# Patient Record
Sex: Male | Born: 1980 | Hispanic: Yes | Marital: Married | Smoking: Never smoker
Health system: Southern US, Community
[De-identification: ages and names within clinical notes are randomized; demographics above are authoritative.]

---

## 2014-06-19 ENCOUNTER — Ambulatory Visit (INDEPENDENT_AMBULATORY_CARE_PROVIDER_SITE_OTHER): Payer: 59 | Admitting: Internal Medicine

## 2014-06-19 ENCOUNTER — Ambulatory Visit (INDEPENDENT_AMBULATORY_CARE_PROVIDER_SITE_OTHER): Payer: 59

## 2014-06-19 VITALS — BP 132/78 | HR 82 | Temp 98.5°F | Resp 16 | Ht 65.5 in | Wt 182.2 lb

## 2014-06-19 DIAGNOSIS — M25531 Pain in right wrist: Secondary | ICD-10-CM

## 2014-06-19 DIAGNOSIS — M778 Other enthesopathies, not elsewhere classified: Secondary | ICD-10-CM

## 2014-06-19 DIAGNOSIS — S63501A Unspecified sprain of right wrist, initial encounter: Secondary | ICD-10-CM

## 2014-06-19 MED ORDER — MELOXICAM 15 MG PO TABS: 15.0000 mg | ORAL_TABLET | Freq: Every day | ORAL | Status: DC

## 2014-06-19 NOTE — Patient Instructions (Signed)
Ice, wear the splint while awake for 3 weeks even while at work. meloxicam as directed.  Return for re eval in 2 weeks or sooner if your symptoms worsen

## 2014-06-19 NOTE — Progress Notes (Signed)
   Subjective:    Patient ID: Jose Sparks, male    DOB: [birth year], 34 y.o.   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 No direct blunt  injury .  Pain in moderate in severity 6/10 in the radial area of the right wrist and is worse with motion   Pain radiates up the right arm with motion of the right hand  Occasionally notes swelling     Review of Systems  Musculoskeletal: Positive for joint swelling.       Pain and swelling of the right wrist  All other systems reviewed and are negative.      Objective:   Physical Exam  Constitutional: He is oriented to person, place, and time. He appears well-developed and well-nourished.  HENT:  Head: Normocephalic and atraumatic.  Nose: Nose normal.  Mouth/Throat: Oropharynx is clear and moist.  Eyes: Conjunctivae and EOM are normal. Pupils are equal, round, and reactive to light.  Neck: Normal range of motion. Neck supple.  Cardiovascular: Normal rate, regular rhythm and normal heart sounds.   Pulmonary/Chest: Effort normal and breath sounds normal.  Abdominal: Soft. Bowel sounds are normal.  Musculoskeletal: Normal range of motion. He exhibits tenderness.  Tenderness along the radial aspect of the right wrist, not tender in the anatomical snuff box  Pain on axial load and flexion Slight swelling no bruising  Pulse is normal radially  No pain over the flexor retinaculum  no numbness  Sensation and motor function of the r wrist is normal  Neurological: He is alert and oriented to person, place, and time. He has normal reflexes.  Skin: Skin is warm and dry.  Psychiatric: He has a normal mood and affect. His behavior is normal. Judgment and thought content normal.  Nursing note and vitals reviewed.   [facility] reading (PRIMARY) by  Dr. Glenis SmokerGottleib. negative for fracture or dislocation       Assessment & Plan:  1. Wrist pain 2. Wrist swelling Consistent with a Diagnosis of overuse tendonitis, wrist sprain,  Will treat with meloxicam and wrist immobilization with a thumb spica immobilizer placed in the office. The neuro vascular status of the right hand was re checked and is normal after application of the splint with removal splint. folllowup in 2 weeks for re eval

## 2016-04-01 ENCOUNTER — Ambulatory Visit (INDEPENDENT_AMBULATORY_CARE_PROVIDER_SITE_OTHER): Payer: 59 | Admitting: Family Medicine

## 2016-04-01 VITALS — BP 119/78 | HR 65 | Temp 98.5°F | Resp 16 | Ht 65.5 in | Wt 172.0 lb

## 2016-04-01 DIAGNOSIS — A09 Infectious gastroenteritis and colitis, unspecified: Secondary | ICD-10-CM

## 2016-04-01 DIAGNOSIS — J069 Acute upper respiratory infection, unspecified: Secondary | ICD-10-CM

## 2016-04-01 DIAGNOSIS — R197 Diarrhea, unspecified: Secondary | ICD-10-CM

## 2016-04-01 LAB — POCT CBC
Granulocyte percent: 68.5 %G (ref 37–80)
HCT, POC: 40.5 % — AB (ref 43.5–53.7)
HEMOGLOBIN: 14 g/dL — AB (ref 14.1–18.1)
LYMPH, POC: 1.6 (ref 0.6–3.4)
MCH, POC: 28.6 pg (ref 27–31.2)
MCHC: 34.7 g/dL (ref 31.8–35.4)
MCV: 82.5 fL (ref 80–97)
MID (cbc): 0.5 (ref 0–0.9)
MPV: 6.4 fL (ref 0–99.8)
PLATELET COUNT, POC: 233 10*3/uL (ref 142–424)
POC Granulocyte: 4.7 (ref 2–6.9)
POC LYMPH PERCENT: 23.5 %L (ref 10–50)
POC MID %: 8 %M (ref 0–12)
RBC: 4.91 M/uL (ref 4.69–6.13)
RDW, POC: 12.9 %
WBC: 6.8 10*3/uL (ref 4.6–10.2)

## 2016-04-01 MED ORDER — DICYCLOMINE HCL 10 MG PO CAPS: ORAL_CAPSULE | ORAL | 0 refills | Status: DC

## 2016-04-01 NOTE — Progress Notes (Signed)
Patient ID: Jose Sparks, male    DOB: 1980-09-20  Age: 36 y.o. MRN: [id number]  Chief Complaint  Patient presents with  . Diarrhea    x saturday    Subjective:   36 year old male who is generally healthy. Friday he started feeling a little bad with some fever and aches and she chills a little bit, but no diarrhea. On Saturday started having diarrhea which has persisted through Sunday and Monday and a little bit today. When he is had diarrhea in the past, it usually has been after eating something that triggered it and may be one round of diarrhea but then cleared on up. This is a little unusual for him. He is married with a family and others have not been ill. He has developed a mild upper respiratory infection symptoms today with a little irritation in his throat and mild coughing but not really a sore throat or runny nose or anything.  He works as a Quarry manager. He is [place]  Current allergies, medications, problem list, past/family and social histories reviewed.  Objective:  BP 119/78   Pulse 65   Temp 98.5 F (36.9 C) (Oral)   Resp 16   Ht 5' 5.5" (1.664 m)   Wt 172 lb (78 kg)   SpO2 98%   BMI 28.19 kg/m   No acute distress. Occasional mild clearing of his throat or cough. His throat is clear. Neck supple without nodes. Chest clear to auscultation. Heart regular without murmurs. Abdomen has normal bowel sounds. Not distended. Soft without organomegaly, masses, or tenderness. Genitorectal exam not done.  Assessment & Plan:   Assessment: 1. Diarrhea of presumed infectious origin   2. Acute upper respiratory infection       Plan: We'll check a CBC and CMP and decided what treatment he needs.  Orders Placed This Encounter  Procedures  . Comprehensive metabolic panel  . POCT CBC    Meds ordered this encounter  Medications  . dicyclomine (BENTYL) 10 MG capsule    Sig: Take 1 pill with meals and at bedtime as needed for diarrhea or abdominal  discomfort    Dispense:  20 capsule    Refill:  0   Results for orders placed or performed in visit on 04/01/16  POCT CBC  Result Value Ref Range   WBC 6.8 4.6 - 10.2 K/uL   Lymph, poc 1.6 0.6 - 3.4   POC LYMPH PERCENT 23.5 10 - 50 %L   MID (cbc) 0.5 0 - 0.9   POC MID % 8.0 0 - 12 %M   POC Granulocyte 4.7 2 - 6.9   Granulocyte percent 68.5 37 - 80 %G   RBC 4.91 4.69 - 6.13 M/uL   Hemoglobin 14.0 (A) 14.1 - 18.1 g/dL   HCT, POC 40.9 (A) 81.1 - 53.7 %   MCV 82.5 80 - 97 fL   MCH, POC 28.6 27 - 31.2 pg   MCHC 34.7 31.8 - 35.4 g/dL   RDW, POC 91.4 %   Platelet Count, POC 233 142 - 424 K/uL   MPV 6.4 0 - 99.8 fL   f      Patient Instructions   Drink plenty of fluids. Avoid rich, spicy, or fried and fatty foods.  Take dicyclomine 10 mg 1 pill with meals and at bedtime. This should help slow the bowel movements down.  Return if worse    IF you received an x-ray today, you will receive an invoice from [facility]. Please contact [facility] at [phone] with questions or concerns regarding your invoice.   IF you received labwork today, you will receive an invoice from [facility]. Please contact [facility] at [phone] with questions or concerns regarding your invoice.   Our billing staff will not be able to assist you with questions regarding bills from these companies.  You will be contacted with the lab results as soon as they are available. The fastest way to get your results is to activate your My Chart account. Instructions are located on the last page of this paperwork. If you have not heard from us regarding the results in 2 weeks, please contact this office.         Return if symptoms worsen or fail to improve.   Daxter Paule, MD 04/01/2016

## 2016-04-01 NOTE — Patient Instructions (Addendum)
Drink plenty of fluids. Avoid rich, spicy, or fried and fatty foods.  Take dicyclomine 10 mg 1 pill with meals and at bedtime. This should help slow the bowel movements down.  Return if worse    IF you received an x-ray today, you will receive an invoice from [facility]. Please contact [facility] at [phone] with questions or concerns regarding your invoice.   IF you received labwork today, you will receive an invoice from [facility]. Please contact [facility] at [phone] with questions or concerns regarding your invoice.   Our billing staff will not be able to assist you with questions regarding bills from these companies.  You will be contacted with the lab results as soon as they are available. The fastest way to get your results is to activate your My Chart account. Instructions are located on the last page of this paperwork. If you have not heard from us regarding the results in 2 weeks, please contact this office.

## 2016-04-02 ENCOUNTER — Encounter: Payer: Self-pay | Admitting: Emergency Medicine

## 2016-04-02 LAB — COMPREHENSIVE METABOLIC PANEL
A/G RATIO: 1.5 (ref 1.2–2.2)
ALK PHOS: 71 IU/L (ref 39–117)
ALT: 49 IU/L — ABNORMAL HIGH (ref 0–44)
AST: 28 IU/L (ref 0–40)
Albumin: 4.7 g/dL (ref 3.5–5.5)
BILIRUBIN TOTAL: 0.5 mg/dL (ref 0.0–1.2)
BUN/Creatinine Ratio: 11 (ref 9–20)
BUN: 10 mg/dL (ref 6–20)
CHLORIDE: 98 mmol/L (ref 96–106)
CO2: 24 mmol/L (ref 18–29)
CREATININE: 0.88 mg/dL (ref 0.76–1.27)
Calcium: 9.4 mg/dL (ref 8.7–10.2)
GFR calc Af Amer: 127 mL/min/{1.73_m2} (ref >59)
GFR calc non Af Amer: 110 mL/min/{1.73_m2} (ref >59)
GLOBULIN, TOTAL: 3.2 g/dL (ref 1.5–4.5)
Glucose: 88 mg/dL (ref 65–99)
POTASSIUM: 4.4 mmol/L (ref 3.5–5.2)
SODIUM: 138 mmol/L (ref 134–144)
Total Protein: 7.9 g/dL (ref 6.0–8.5)

## 2016-11-08 ENCOUNTER — Encounter: Payer: Self-pay | Admitting: Family Medicine

## 2016-11-08 ENCOUNTER — Ambulatory Visit (INDEPENDENT_AMBULATORY_CARE_PROVIDER_SITE_OTHER): Payer: 59 | Admitting: Family Medicine

## 2016-11-08 ENCOUNTER — Ambulatory Visit (INDEPENDENT_AMBULATORY_CARE_PROVIDER_SITE_OTHER): Payer: 59

## 2016-11-08 VITALS — BP 115/75 | HR 66 | Temp 98.6°F | Resp 16 | Ht 65.5 in | Wt 155.0 lb

## 2016-11-08 DIAGNOSIS — M25561 Pain in right knee: Secondary | ICD-10-CM | POA: Diagnosis not present

## 2016-11-08 DIAGNOSIS — S83421A Sprain of lateral collateral ligament of right knee, initial encounter: Secondary | ICD-10-CM

## 2016-11-08 MED ORDER — MELOXICAM 15 MG PO TABS: 15.0000 mg | ORAL_TABLET | Freq: Every day | ORAL | 0 refills | Status: DC

## 2016-11-08 NOTE — Patient Instructions (Signed)
Esguince del ligamento lateral externo de la rodilla con rehabilitacin faseI Lateral Collateral Knee Ligament Sprain, Phase I Rehab Consulte al mdico qu ejercicios son seguros para usted. Haga los ejercicios exactamente como se lo haya indicado el mdico y gradelos como se lo hayan indicado. Es normal sentir tirantez, tensin, presin o molestias leves mientras hace estos ejercicios, pero debe detenerse de inmediato si siente dolor repentino o si el dolor empeora.No comience a hacer estos ejercicios hasta que se lo indique el mdico. EJERCICIOS DE [employer] Y AMPLITUD DE MOVIMIENTOS Estos ejercicios calientan los msculos y las articulaciones, y mejoran la movilidad y la flexibilidad de la rodilla. Estos ejercicios tambin ayudan a Engineer, materials, el adormecimiento y el hormigueo. EjercicioA: Deslizamiento de taln 1. Acustese boca arriba con ambas rodillas extendidas. Si esto le ocasiona molestias en la espalda, flexione la rodilla sana, apoyando el pie en el suelo. 2. Acerque lentamente el taln izquierdo/derecho a las nalgas, Teacher, adult education sentir un estiramiento suave en la parte de adelante de la rodilla o el muslo. 3. Mantenga esta posicin durante ___________ segundos. 4. Lleve lentamente el taln izquierdo/derecho hasta la posicin inicial. Repita __________ veces. Realice este ejercicio __________ veces al da. EJERCICIOS DE FORTALECIMIENTO Estos ejercicios fortalecen la rodilla y le otorgan resistencia. La resistencia es la capacidad de usar los msculos durante un tiempo prolongado, incluso despus de que se cansen. EjercicioB: Elevaciones de la pierna extendida, cudriceps 1. Recustese boca arriba con la pierna izquierda/derecha extendida y la rodilla opuesta flexionada. 2. Tensione los msculos de la parte de adelante del muslo izquierdo/derecho. Deber ver la rtula que se desliza hacia arriba o que se profundizan los hoyuelos justo por encima de la rodilla. El muslo podr sacudirse  un poco. 3. Mantenga estos msculos contrados mientras levanta la pierna a una altura de 4 a 6pulgadas (10 a 15cm) del suelo. No flexione la rodilla. Si no puede evitar flexionar la rodilla al hacer este ejercicio, contraiga los msculos de la parte de adelante del muslo izquierdo/derecho, pero no eleve la pierna. 4. Mantenga esta posicin durante ___________ segundos. 5. Mantenga estos msculos contrados mientras baja la pierna. 6. Relaje los msculos lentamente y por completo. Repita __________ veces. Realice este ejercicio __________ veces al da. EjercicioC: Isquiotibiales con flexin Realice este ejercicio con pesas en los tobillos si se lo ha indicado el mdico. Comience con pesas de __________. Aumente el peso de [place] gradual, de a 1lb [phone]). No use pesas que pesen ms de __________. 1. Recustese boca abajo con las piernas extendidas. 2. Flexione la rodilla izquierda/derecha hasta donde se sienta cmodo. Mantenga la cadera sobre la superficie debajo. Al flexionar la rodilla, acerque el pie a las nalgas. No deje que se caiga a los costados. 3. Mantenga esta posicin durante ___________ segundos. 4. Baje lentamente la pierna hasta la posicin inicial. Repita __________ veces. Realice este ejercicio __________ veces al da. EjercicioD: Puente (extensores de la cadera) 1. Recustese boca arriba sobre una superficie firme con las rodillas flexionadas y los pies completamente apoyados en el suelo. 2. Tensione los msculos de las nalgas y [employer] que el tronco quede a nivel de los muslos.  No arquee la espalda.  Debe sentir el trabajo muscular en las nalgas y la parte de atrs de los muslos. Si no siente el estiramiento de estos msculos, aleje los pies 1 o 2pulgadas (2,5 o 5cm) de las nalgas. 1. Mantenga esta posicin durante ___________ segundos. 2. Baje lentamente la cadera a la posicin inicial. 3. Relaje los  msculos de las nalgas por completo entre  repeticiones. 4. Si este ejercicio le resulta muy fcil, intente realizarlo con los brazos cruzados sobre el pecho o levantando una pierna con la cadera levantada del suelo. Repita __________ veces. Realice este ejercicio __________ veces al da. EjercicioE: Elevacin del taln 1. Prese con los pies separados al ancho de los hombros. 2. Equilibre el peso de cuerpo sobre ambos pies [place]ientras se para en puntas de pie. Use una pared o una mesa para no perder el equilibrio, pero trate de no usarlos como apoyo. 3. Si este ejercicio le resulta demasiado fcil, lleve el peso hacia la pierna izquierda/derecha hasta sentir que le cuesta un poco ms. 4. Mantenga esta posicin durante ___________ segundos. 5. Baje lentamente hasta la posicin inicial. Repita __________ veces. Realice este ejercicio __________ veces al da. Esta informacin no tiene Theme park managercomo fin reemplazar el consejo del mdico. Asegrese de hacerle al mdico cualquier pregunta que tenga. Document Released: 01/01/2006 Document Revised: 04/07/2014 Document Reviewed: 01/27/2015 Elsevier Interactive Patient Education  2018 [employer]      IF you received an x-ray today, you will receive an invoice from [facility]. Please contact [facility] at [phone] with questions or concerns regarding your invoice.   IF you received labwork today, you will receive an invoice from [facility]. Please contact [facility] at [phone] with questions or concerns regarding your invoice.   Our billing staff will not be able to assist you with questions regarding bills from these companies.  You will be contacted with the lab results as soon as they are available. The fastest way to get your results is to activate your My Chart account. Instructions are located on the last page of this paperwork. If you have not heard from us regarding the results in 2 weeks, please contact this office.

## 2016-11-08 NOTE — Progress Notes (Signed)
Subjective:    Patient ID: Jose Sparks, male    DOB: Sep 04, 1980, 36 y.o.   MRN: [id number]  11/08/2016  Knee Pain (right, running after 2-3 miles, constant, throbbing)   HPI This 36 y.o. male presents for evaluation of R knee pain.   Training to run half marathon.  Onset of pain two weeks ago.  After 2-3 miles, developedR knee pain. Usually running 8-53miles.  Throbbing and pain; needed to stop.  Rested this entire week of running; tried to run again today, developed pain again.  No ice.  No swelling.  No limping.  Just pain.  Pain laterally and under knee cap.  Had been running 8-10 miles for one month.  Just started training in January 2018; two miles and increase 1-2 miles each month; half marathon in October 2018.  Had not been runnning for years.  NO medication for knee pain.  Help desk support.  Stopped two weeks ago.     BP Readings from Last 3 Encounters:  11/08/16 115/75  04/01/16 119/78  06/19/14 132/78   Wt Readings from Last 3 Encounters:  11/08/16 155 lb (70.3 kg)  04/01/16 172 lb (78 kg)  06/19/14 182 lb 3.2 oz (82.6 kg)    There is no immunization history on file for this patient.  Review of Systems  Constitutional: Negative for activity change, appetite change, chills, diaphoresis, fatigue and fever.  Respiratory: Negative for cough and shortness of breath.   Cardiovascular: Negative for chest pain, palpitations and leg swelling.  Gastrointestinal: Negative for abdominal pain, diarrhea, nausea and vomiting.  Endocrine: Negative for cold intolerance, heat intolerance, polydipsia, polyphagia and polyuria.  Musculoskeletal: Positive for arthralgias. Negative for gait problem and joint swelling.  Skin: Negative for color change, rash and wound.  Neurological: Negative for dizziness, tremors, seizures, syncope, facial asymmetry, speech difficulty, weakness, light-headedness, numbness and headaches.  Psychiatric/Behavioral: Negative for dysphoric mood and sleep  disturbance. The patient is not nervous/anxious.     History reviewed. No pertinent past medical history. History reviewed. No pertinent surgical history. No Known Allergies  Social History   Social History  . Marital status: Married    Spouse name: N/A  . Number of children: N/A  . Years of education: N/A   Occupational History  . Not on file.   Social History Main Topics  . Smoking status: Never Smoker  . Smokeless tobacco: Never Used  . Alcohol use No  . Drug use: No  . Sexual activity: Not on file   Other Topics Concern  . Not on file   Social History Narrative  . No narrative on file   Family History  Problem Relation Age of Onset  . Diabetes Mother        Objective:    BP 115/75   Pulse 66   Temp 98.6 F (37 C) (Oral)   Resp 16   Ht 5' 5.5" (1.664 m)   Wt 155 lb (70.3 kg)   SpO2 98%   BMI 25.40 kg/m  Physical Exam  Constitutional: He is oriented to person, place, and time. He appears well-developed and well-nourished. No distress.  HENT:  Head: Normocephalic and atraumatic.  Eyes: Pupils are equal, round, and reactive to light. Conjunctivae and EOM are normal.  Neck: Normal range of motion. Neck supple. Carotid bruit is not present.  Cardiovascular: Intact distal pulses.   Pulmonary/Chest: Effort normal.  Musculoskeletal:       Right knee: Normal. He exhibits normal range of motion, no swelling,  no effusion, no bony tenderness and normal meniscus. No tenderness found. No medial joint line, no lateral joint line, no MCL, no LCL and no patellar tendon tenderness noted.  Neurological: He is alert and oriented to person, place, and time. No cranial nerve deficit.  Skin: Skin is warm and dry. No rash noted. He is not diaphoretic.  Psychiatric: He has a normal mood and affect. His behavior is normal.  Nursing note and vitals reviewed.   Dg Knee Complete 4 Views Right  Result Date: 11/08/2016 CLINICAL DATA:  Knee pain after running EXAM: RIGHT KNEE -  COMPLETE 4+ VIEW COMPARISON:  None. FINDINGS: No acute fracture. No dislocation.  Unremarkable soft tissues. IMPRESSION: No acute bony pathology. Electronically Signed   By: Jolaine ClickArthur  Hoss M.D.   On: 11/08/2016 11:55   [facility] screen [facility] 11/08/2016 04/01/2016  Decreased Interest 0 0  Down, Depressed, Hopeless 0 0  PHQ - 2 Score 0 0   Fall Risk  11/08/2016 04/01/2016  Falls in the past year? No No        Assessment & Plan:   1. Acute pain of right knee    -New onset; occurs 2-3 miles into run; most consistent with IT band syndrome or patellofemoral syndrome.  Recommend rest, icing stretching, NSAIDs. -call office in two weeks if no improvement and will schedule follow-up appointment with Dr. Silas SacramentoJeff Greene.   Orders Placed This Encounter  Procedures  . DG Knee Complete 4 Views Right    Standing Status:   Future    Number of Occurrences:   1    Standing Expiration Date:   11/08/2017    Order Specific Question:   Reason for Exam (SYMPTOM  OR DIAGNOSIS REQUIRED)    Answer:   R lateral knee pain after 2-3 miles into run; no swelling; no popping    Order Specific Question:   Preferred imaging location?    Answer:   External   Meds ordered this encounter  Medications  . meloxicam (MOBIC) 15 MG tablet    Sig: Take 1 tablet (15 mg total) by mouth daily.    Dispense:  30 tablet    Refill:  0    No Follow-up on file.   Chad Tiznado Paulita FujitaMartin Pierra Skora, M.D. Primary [facility] at [facility]  [place] [facility] [address] [place], [place]  [id number] [phone] phone [phone] fax

## 2017-08-20 ENCOUNTER — Encounter: Payer: Self-pay | Admitting: Family Medicine

## 2018-05-30 IMAGING — DX DG KNEE COMPLETE 4+V*R*
4 series · 4 of 4 positions shown · non-contrast
Comparison: None.

CLINICAL DATA: Knee pain after running

EXAM:
RIGHT KNEE - COMPLETE 4+ VIEW

[knee ap]
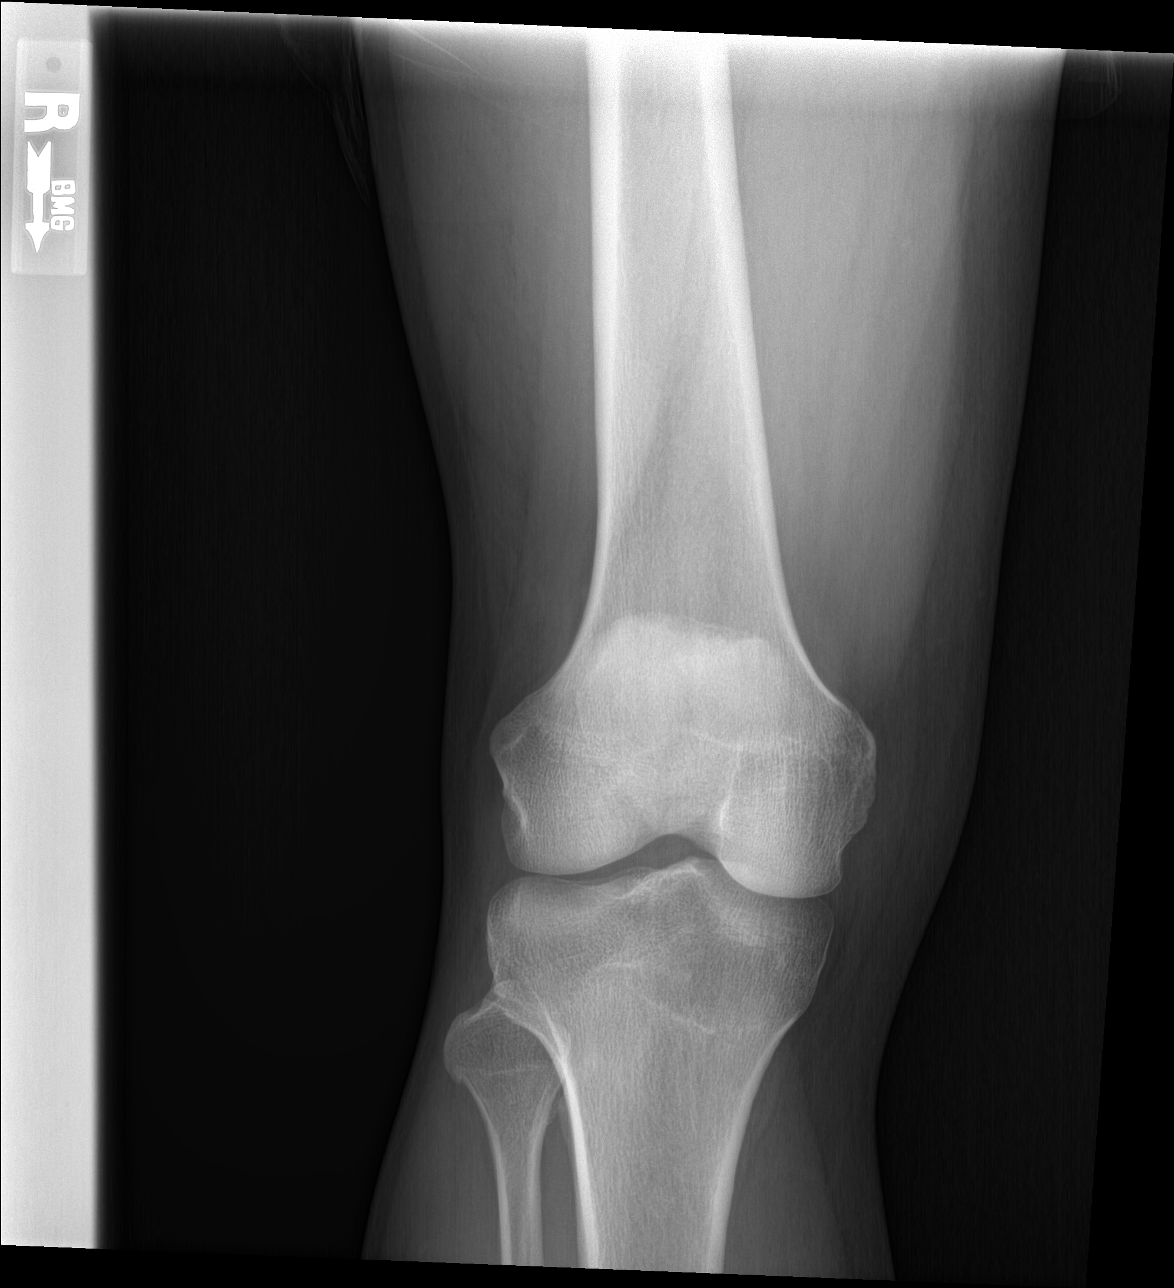

[knee lat]
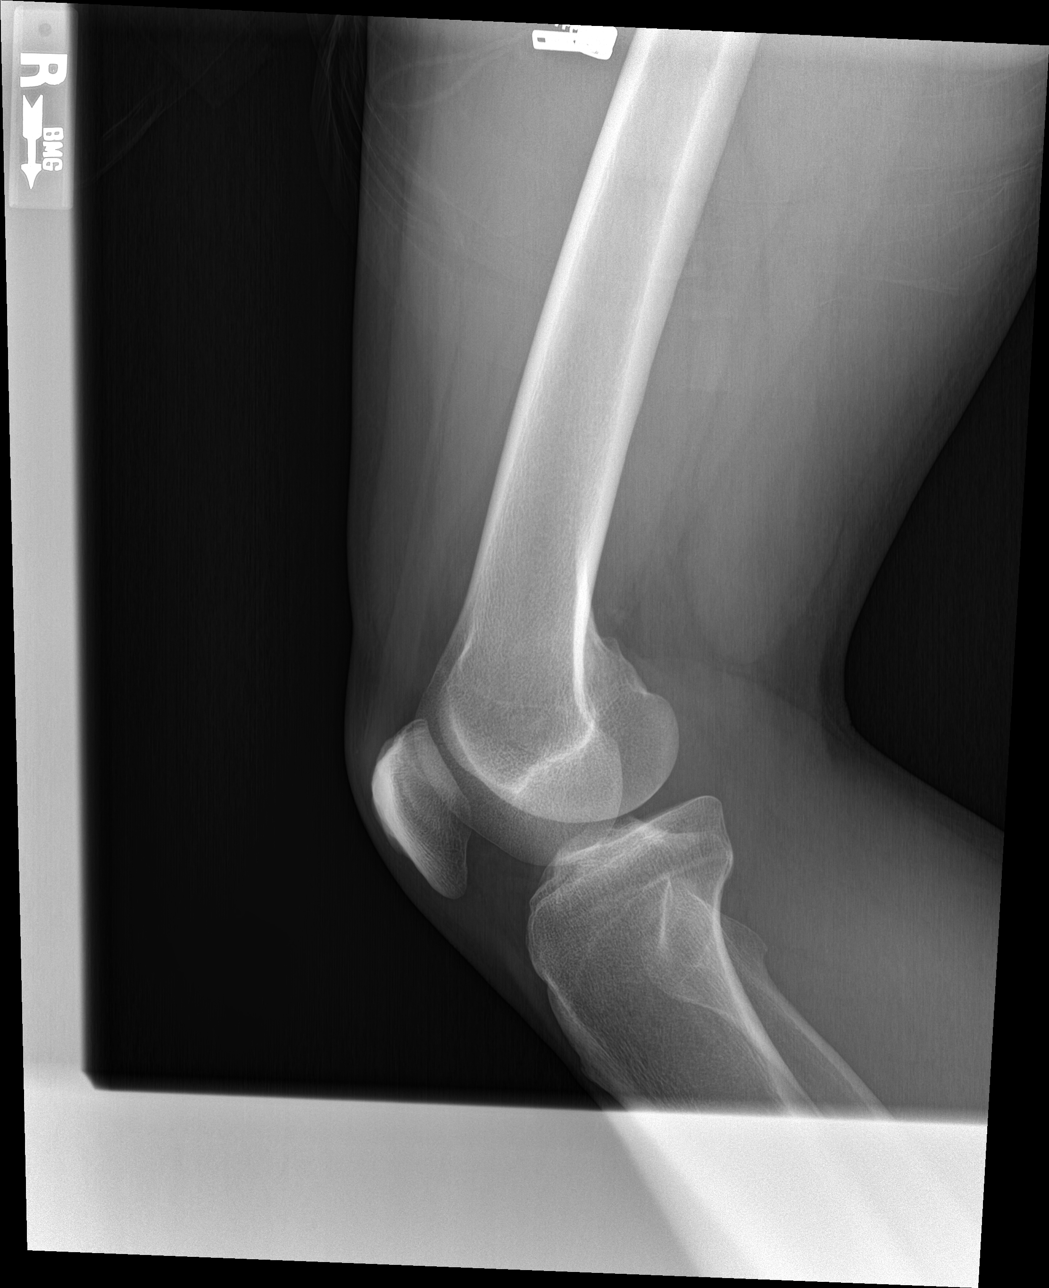

[sunrise]
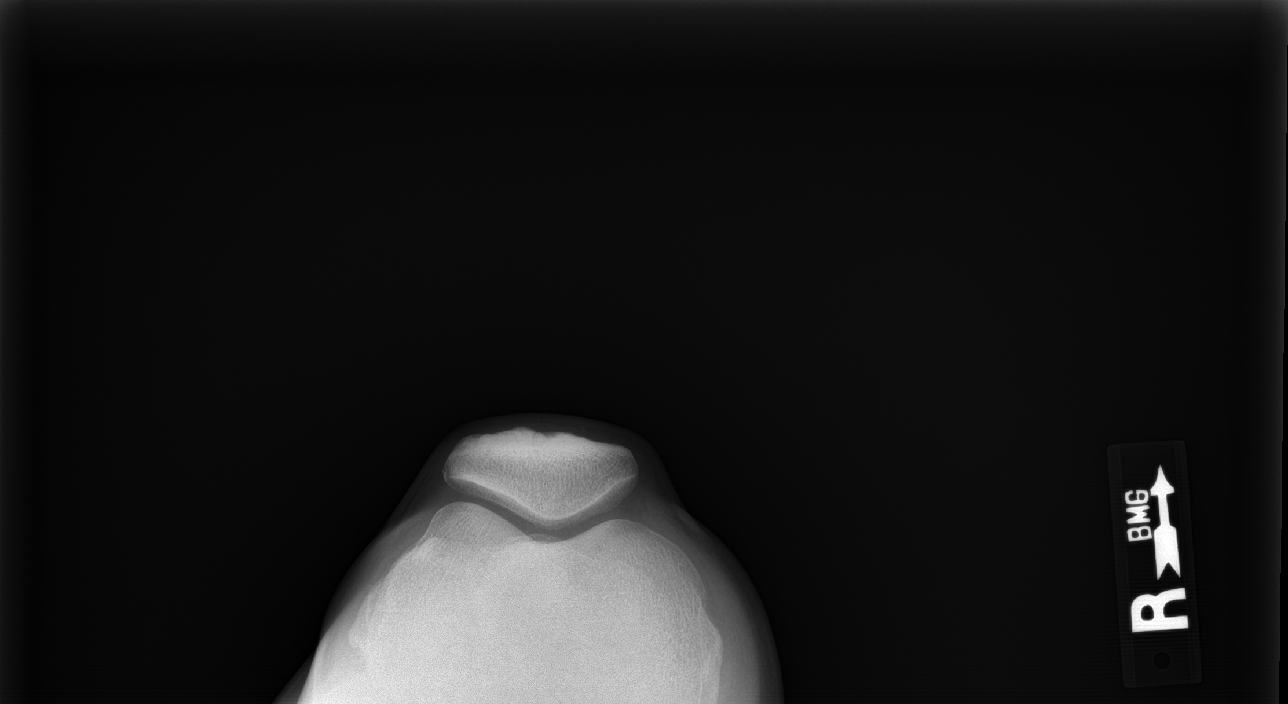

[knee [person_name]]
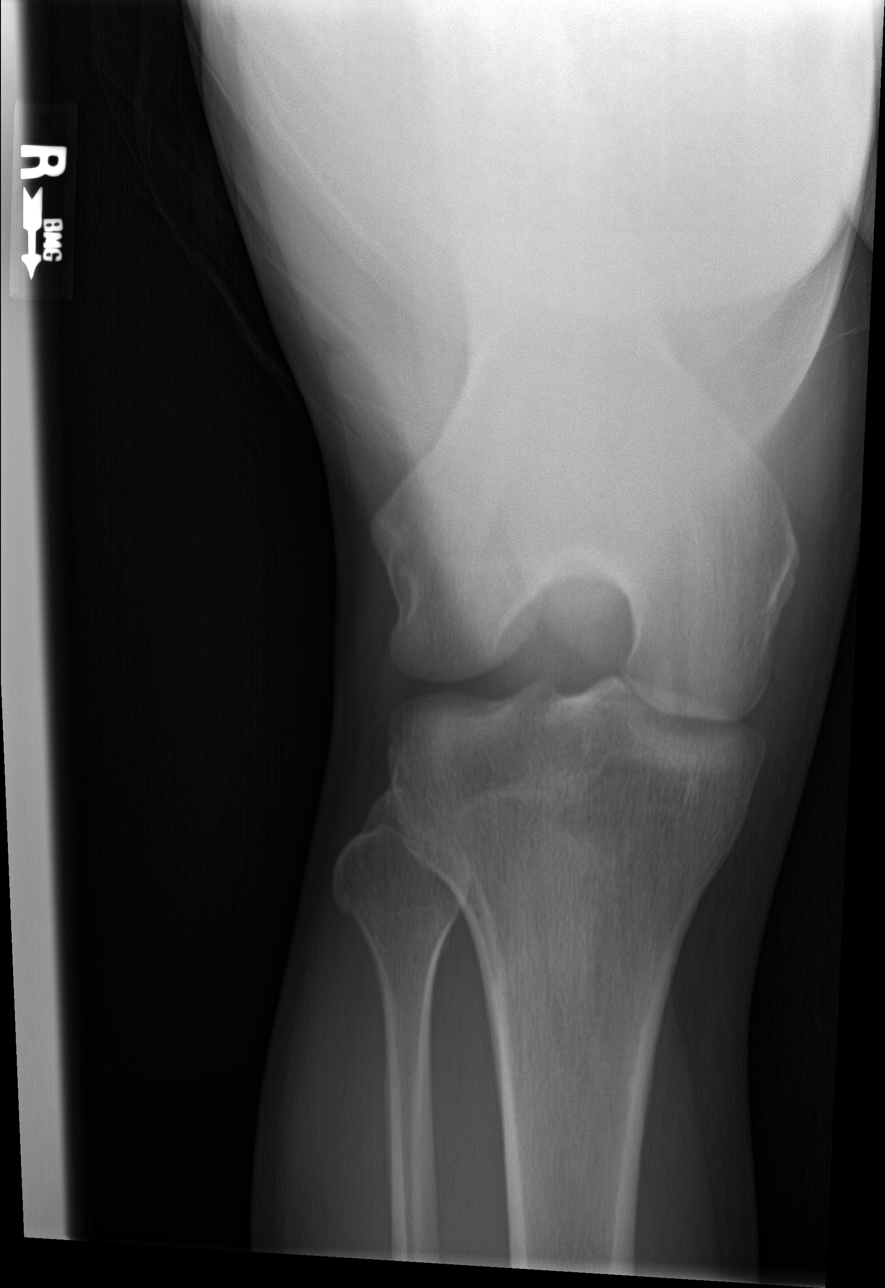

[4 of 4 positions shown; findings below may reference images not displayed]

FINDINGS: No acute fracture. No dislocation.  Unremarkable soft tissues.
IMPRESSION: No acute bony pathology.

## 2019-11-11 ENCOUNTER — Encounter (HOSPITAL_COMMUNITY): Payer: Self-pay

## 2019-11-11 ENCOUNTER — Ambulatory Visit (HOSPITAL_COMMUNITY)
Admission: EM | Admit: 2019-11-11 | Discharge: 2019-11-11 | Disposition: A | Discharge status: Home / Self Care | Payer: 59 | Attending: Family Medicine | Admitting: Family Medicine

## 2019-11-11 ENCOUNTER — Other Ambulatory Visit: Payer: Self-pay

## 2019-11-11 DIAGNOSIS — M25561 Pain in right knee: Secondary | ICD-10-CM

## 2019-11-11 MED ORDER — DICLOFENAC SODIUM 75 MG PO TBEC: 75.0000 mg | DELAYED_RELEASE_TABLET | Freq: Two times a day (BID) | ORAL | 0 refills | Status: AC

## 2019-11-11 NOTE — ED Triage Notes (Signed)
Pt reports right knee when when running about 2-3 miles. Pt has not tried any medication for the pain.

## 2019-11-12 NOTE — ED Provider Notes (Signed)
[facility]   [id number] 11/11/19 Arrival Time: 1648  ASSESSMENT & PLAN:  1. Acute pain of right knee     See AVS for d/c information. No indication for plain imaging of knee.  Begin trial of: Meds ordered this encounter  Medications  . diclofenac (VOLTAREN) 75 MG EC tablet    Sig: Take 1 tablet (75 mg total) by mouth 2 (two) times daily.    Dispense:  14 tablet    Refill:  0    WBAT.  Recommend:  Follow-up Information    Schedule an appointment as soon as possible for a visit  with [facility].   Contact information: [address] [place] [address] [phone]              Reviewed expectations re: course of current medical issues. Questions answered. Outlined signs and symptoms indicating need for more acute intervention. Patient verbalized understanding. After Visit Summary given.  SUBJECTIVE: History from: patient. Nelton Amsden is a 39 y.o. male who reports intermittent right knee pain. Is a runner. Pain much worse after running 2-3 miles; should be able to run longer but must stop secondary to knee pain. Poorly localized. Questions more anteriorly/laterally. Does not wake him at night. Is better with rest. No extremity sensation changes or weakness. No OTC tx. Unsure of duration of symptoms. No knee trauma.  History reviewed. No pertinent surgical history.    OBJECTIVE:  Vitals:   11/11/19 1810  BP: 117/64  Pulse: (!) 57  Resp: 16  Temp: 99.2 F (37.3 C)  TempSrc: Oral  SpO2: 100%    General appearance: alert; no distress HEENT: [place]; AT Neck: supple with FROM Resp: unlabored respirations Extremities: . RLE: warm with well perfused appearance; without localized tenderness over knee; without gross deformities; swelling: none; bruising: none; knee ROM: normal; without pops or clicks or instability Neurologic: gait normal; normal sensation and strength of RLE Psychological: alert and  cooperative; normal mood and affect    No Known Allergies  History reviewed. No pertinent past medical history. Social History   Socioeconomic History  . Marital status: Married    Spouse name: Not on file  . Number of children: Not on file  . Years of education: Not on file  . Highest education level: Not on file  Occupational History  . Not on file  Tobacco Use  . Smoking status: Never Smoker  . Smokeless tobacco: Never Used  Vaping Use  . Vaping Use: Never used  Substance and Sexual Activity  . Alcohol use: No    Alcohol/week: 0.0 standard drinks  . Drug use: No  . Sexual activity: Not on file  Other Topics Concern  . Not on file  Social History Narrative  . Not on file   Social Determinants of Health   Financial Resource Strain:   . Difficulty of Paying Living Expenses:   Food Insecurity:   . Worried About Programme researcher, broadcasting/film/video in the Last Year:   . Barista in the Last Year:   Transportation Needs:   . Freight forwarder (Medical):   Marland Kitchen Lack of Transportation (Non-Medical):   Physical Activity:   . Days of Exercise per Week:   . Minutes of Exercise per Session:   Stress:   . Feeling of Stress :   Social Connections:   . Frequency of Communication with Friends and Family:   . Frequency of Social Gatherings with Friends and  Family:   . Attends Religious Services:   . Active Member of Clubs or Organizations:   . Attends Banker Meetings:   Marland Kitchen Marital Status:    Family History  Problem Relation Age of Onset  . Diabetes Mother    History reviewed. No pertinent surgical history.    Mardella Layman, MD 11/12/19 1012

## 2020-03-13 ENCOUNTER — Ambulatory Visit: Payer: 59 | Admitting: Emergency Medicine

## 2020-03-13 ENCOUNTER — Encounter: Payer: Self-pay | Admitting: Emergency Medicine

## 2020-03-13 ENCOUNTER — Other Ambulatory Visit: Payer: Self-pay

## 2020-03-13 VITALS — BP 112/70 | HR 72 | Temp 98.0°F | Resp 16 | Ht 66.0 in | Wt 158.0 lb

## 2020-03-13 DIAGNOSIS — Z7689 Persons encountering health services in other specified circumstances: Secondary | ICD-10-CM

## 2020-03-13 DIAGNOSIS — Z13220 Encounter for screening for lipoid disorders: Secondary | ICD-10-CM

## 2020-03-13 DIAGNOSIS — Z1329 Encounter for screening for other suspected endocrine disorder: Secondary | ICD-10-CM | POA: Diagnosis not present

## 2020-03-13 DIAGNOSIS — Z13228 Encounter for screening for other metabolic disorders: Secondary | ICD-10-CM

## 2020-03-13 DIAGNOSIS — Z130 Encounter for screening for diseases of the blood and blood-forming organs and certain disorders involving the immune mechanism: Secondary | ICD-10-CM

## 2020-03-13 DIAGNOSIS — Z1321 Encounter for screening for nutritional disorder: Secondary | ICD-10-CM

## 2020-03-13 NOTE — Patient Instructions (Addendum)
   If you have lab work done today you will be contacted with your lab results within the next 2 weeks.  If you have not heard from us then please contact us. The fastest way to get your results is to register for My Chart.   IF you received an x-ray today, you will receive an invoice from [facility]. Please contact [facility] at [phone] with questions or concerns regarding your invoice.   IF you received labwork today, you will receive an invoice from [facility]. Please contact [facility] at [phone] with questions or concerns regarding your invoice.   Our billing staff will not be able to assist you with questions regarding bills from these companies.  You will be contacted with the lab results as soon as they are available. The fastest way to get your results is to activate your My Chart account. Instructions are located on the last page of this paperwork. If you have not heard from us regarding the results in 2 weeks, please contact this office.      Health Maintenance, Male Adopting a healthy lifestyle and getting preventive care are important in promoting health and wellness. Ask your health care provider about:  The right schedule for you to have regular tests and exams.  Things you can do on your own to prevent diseases and keep yourself healthy. What should I know about diet, weight, and exercise? Eat a healthy diet   Eat a diet that includes plenty of vegetables, fruits, low-fat dairy products, and lean protein.  Do not eat a lot of foods that are high in solid fats, added sugars, or sodium. Maintain a healthy weight Body mass index (BMI) is a measurement that can be used to identify possible weight problems. It estimates body fat based on height and weight. Your health care provider can help determine your BMI and help you achieve or maintain a healthy weight. Get regular exercise Get regular exercise. This is one of the most important things you  can do for your health. Most adults should:  Exercise for at least 150 minutes each week. The exercise should increase your heart rate and make you sweat (moderate-intensity exercise).  Do strengthening exercises at least twice a week. This is in addition to the moderate-intensity exercise.  Spend less time sitting. Even light physical activity can be beneficial. Watch cholesterol and blood lipids Have your blood tested for lipids and cholesterol at 41 years of age, then have this test every 5 years. You may need to have your cholesterol levels checked more often if:  Your lipid or cholesterol levels are high.  You are older than 40 years of age.  You are at high risk for heart disease. What should I know about cancer screening? Many types of cancers can be detected early and may often be prevented. Depending on your health history and family history, you may need to have cancer screening at various ages. This may include screening for:  Colorectal cancer.  Prostate cancer.  Skin cancer.  Lung cancer. What should I know about heart disease, diabetes, and high blood pressure? Blood pressure and heart disease  High blood pressure causes heart disease and increases the risk of stroke. This is more likely to develop in people who have high blood pressure readings, are of African descent, or are overweight.  Talk with your health care provider about your target blood pressure readings.  Have your blood pressure checked: ? Every 3-5 years if you are 18-39   years of age. ? Every year if you are 40 years old or older.  If you are between the ages of 65 and 75 and are a current or former smoker, ask your health care provider if you should have a one-time screening for abdominal aortic aneurysm (AAA). Diabetes Have regular diabetes screenings. This checks your fasting blood sugar level. Have the screening done:  Once every three years after age 45 if you are at a normal weight and have  a low risk for diabetes.  More often and at a younger age if you are overweight or have a high risk for diabetes. What should I know about preventing infection? Hepatitis B If you have a higher risk for hepatitis B, you should be screened for this virus. Talk with your health care provider to find out if you are at risk for hepatitis B infection. Hepatitis C Blood testing is recommended for:  Everyone born from 1945 through 1965.  Anyone with known risk factors for hepatitis C. Sexually transmitted infections (STIs)  You should be screened each year for STIs, including gonorrhea and chlamydia, if: ? You are sexually active and are younger than 41 years of age. ? You are older than 41 years of age and your health care provider tells you that you are at risk for this type of infection. ? Your sexual activity has changed since you were last screened, and you are at increased risk for chlamydia or gonorrhea. Ask your health care provider if you are at risk.  Ask your health care provider about whether you are at high risk for HIV. Your health care provider may recommend a prescription medicine to help prevent HIV infection. If you choose to take medicine to prevent HIV, you should first get tested for HIV. You should then be tested every 3 months for as long as you are taking the medicine. Follow these instructions at home: Lifestyle  Do not use any products that contain nicotine or tobacco, such as cigarettes, e-cigarettes, and chewing tobacco. If you need help quitting, ask your health care provider.  Do not use street drugs.  Do not share needles.  Ask your health care provider for help if you need support or information about quitting drugs. Alcohol use  Do not drink alcohol if your health care provider tells you not to drink.  If you drink alcohol: ? Limit how much you have to 0-2 drinks a day. ? Be aware of how much alcohol is in your drink. In the [place] one drink equals one 12  oz bottle of beer (355 mL), one 5 oz glass of wine (148 mL), or one 1 oz glass of hard liquor (44 mL). General instructions  Schedule regular health, dental, and eye exams.  Stay current with your vaccines.  Tell your health care provider if: ? You often feel depressed. ? You have ever been abused or do not feel safe at home. Summary  Adopting a healthy lifestyle and getting preventive care are important in promoting health and wellness.  Follow your health care provider's instructions about healthy diet, exercising, and getting tested or screened for diseases.  Follow your health care provider's instructions on monitoring your cholesterol and blood pressure. This information is not intended to replace advice given to you by your health care provider. Make sure you discuss any questions you have with your health care provider. Document Revised: 03/10/2018 Document Reviewed: 03/10/2018 Elsevier Patient Education  2020 Elsevier Inc.  

## 2020-03-13 NOTE — Progress Notes (Signed)
Jose Sparks 39 y.o.   Chief Complaint  Patient presents with   Establish Care    Per patient he has a Family history of Diabetes - mother, sisters    HISTORY OF PRESENT ILLNESS: This is a 39 y.o. male here to establish care with me. Healthy male with a healthy lifestyle. Family history of diabetes. Has no complaints or any other medical concerns.  HPI   Prior to Admission medications   Medication Sig Start Date End Date Taking? Authorizing Provider  diclofenac (VOLTAREN) 75 MG EC tablet Take 1 tablet (75 mg total) by mouth 2 (two) times daily. 11/11/19   Jose Layman, MD    No Known Allergies  There are no problems to display for this patient.   No past medical history on file.  No past surgical history on file.  Social History   Socioeconomic History   Marital status: Married    Spouse name: Not on file   Number of children: Not on file   Years of education: Not on file   Highest education level: Not on file  Occupational History   Not on file  Tobacco Use   Smoking status: Never Smoker   Smokeless tobacco: Never Used  Vaping Use   Vaping Use: Never used  Substance and Sexual Activity   Alcohol use: No    Alcohol/week: 0.0 standard drinks   Drug use: No   Sexual activity: Not on file  Other Topics Concern   Not on file  Social History Narrative   Not on file   Social Determinants of Health   Financial Resource Strain: Not on file  Food Insecurity: Not on file  Transportation Needs: Not on file  Physical Activity: Not on file  Stress: Not on file  Social Connections: Not on file  Intimate Partner Violence: Not on file    Family History  Problem Relation Age of Onset   Diabetes Mother      Review of Systems  Constitutional: Negative.  Negative for chills and fever.  HENT: Negative.  Negative for congestion and sore throat.   Respiratory: Negative.  Negative for cough and shortness of breath.   Cardiovascular: Negative.   Negative for chest pain and palpitations.  Gastrointestinal: Negative.  Negative for abdominal pain, blood in stool, diarrhea, melena, nausea and vomiting.  Genitourinary: Negative.  Negative for flank pain and hematuria.  Musculoskeletal: Negative.  Negative for back pain and myalgias.  Skin: Negative.  Negative for rash.  Neurological: Negative.  Negative for dizziness and headaches.  All other systems reviewed and are negative.   Today's Vitals   03/13/20 1559  BP: 112/70  Pulse: 72  Resp: 16  Temp: 98 F (36.7 C)  TempSrc: Temporal  SpO2: 98%  Weight: 158 lb (71.7 kg)  Height: 5\' 6"  (1.676 m)   Body mass index is 25.5 kg/m.  Physical Exam Vitals reviewed.  Constitutional:      Appearance: Normal appearance.  HENT:     Head: Normocephalic.  Eyes:     Extraocular Movements: Extraocular movements intact.     Conjunctiva/sclera: Conjunctivae normal.     Pupils: Pupils are equal, round, and reactive to light.  Cardiovascular:     Rate and Rhythm: Normal rate and regular rhythm.     Pulses: Normal pulses.     Heart sounds: Normal heart sounds.  Pulmonary:     Effort: Pulmonary effort is normal.     Breath sounds: Normal breath sounds.  Abdominal:  General: There is no distension.     Palpations: Abdomen is soft.     Tenderness: There is no abdominal tenderness.  Musculoskeletal:        General: Normal range of motion.     Cervical back: Normal range of motion and neck supple.  Skin:    General: Skin is warm and dry.     Capillary Refill: Capillary refill takes less than 2 seconds.  Neurological:     General: No focal deficit present.     Mental Status: He is alert and oriented to person, place, and time.  Psychiatric:        Mood and Affect: Mood normal.        Behavior: Behavior normal.      ASSESSMENT & PLAN: Jose SpeakLuis was seen today for establish care.  Diagnoses and all orders for this visit:  Encounter to establish care  Screening for deficiency  anemia -     CBC with Differential/Platelet  Screening for lipoid disorders -     Lipid panel  Screening for endocrine, nutritional, metabolic and immunity disorder -     Comprehensive metabolic panel -     Hemoglobin A1c -     TSH    Patient Instructions       If you have lab work done today you will be contacted with your lab results within the next 2 weeks.  If you have not heard from us then please contact us. The fastest way to get your results is to register for My Chart.   IF you received an x-ray today, you will receive an [facility]. Please contact [facility] at [phone] with questions or concerns regarding your invoice.   IF you received labwork today, you will receive an invoice from [facility]. Please contact [facility] at [phone] with questions or concerns regarding your invoice.   Our billing staff will not be able to assist you with questions regarding bills from these companies.  You will be contacted with the lab results as soon as they are available. The fastest way to get your results is to activate your My Chart account. Instructions are located on the last page of this paperwork. If you have not heard from us regarding the results in 2 weeks, please contact this office.     Health Maintenance, Male Adopting a healthy lifestyle and getting preventive care are important in promoting health and wellness. Ask your health care provider about:  The right schedule for you to have regular tests and exams.  Things you can do on your own to prevent diseases and keep yourself healthy. What should I know about diet, weight, and exercise? Eat a healthy diet   Eat a diet that includes plenty of vegetables, fruits, low-fat dairy products, and lean protein.  Do not eat a lot of foods that are high in solid fats, added sugars, or sodium. Maintain a healthy weight Body mass index (BMI) is a measurement that can be used to identify  possible weight problems. It estimates body fat based on height and weight. Your health care provider can help determine your BMI and help you achieve or maintain a healthy weight. Get regular exercise Get regular exercise. This is one of the most important things you can do for your health. Most adults should:  Exercise for at least 150 minutes each week. The exercise should increase your heart rate and make you sweat (moderate-intensity exercise).  Do strengthening exercises at least twice a week.  This is in addition to the moderate-intensity exercise.  Spend less time sitting. Even light physical activity can be beneficial. Watch cholesterol and blood lipids Have your blood tested for lipids and cholesterol at 41 years of age, then have this test every 5 years. You may need to have your cholesterol levels checked more often if:  Your lipid or cholesterol levels are high.  You are older than 41 years of age.  You are at high risk for heart disease. What should I know about cancer screening? Many types of cancers can be detected early and may often be prevented. Depending on your health history and family history, you may need to have cancer screening at various ages. This may include screening for:  Colorectal cancer.  Prostate cancer.  Skin cancer.  Lung cancer. What should I know about heart disease, diabetes, and high blood pressure? Blood pressure and heart disease  High blood pressure causes heart disease and increases the risk of stroke. This is more likely to develop in people who have high blood pressure readings, are of African descent, or are overweight.  Talk with your health care provider about your target blood pressure readings.  Have your blood pressure checked: ? Every 3-5 years if you are 60-56 years of age. ? Every year if you are 35 years old or older.  If you are between the ages of 60 and 41 and are a current or former smoker, ask your health care  provider if you should have a one-time screening for abdominal aortic aneurysm (AAA). Diabetes Have regular diabetes screenings. This checks your fasting blood sugar level. Have the screening done:  Once every three years after age 29 if you are at a normal weight and have a low risk for diabetes.  More often and at a younger age if you are overweight or have a high risk for diabetes. What should I know about preventing infection? Hepatitis B If you have a higher risk for hepatitis B, you should be screened for this virus. Talk with your health care provider to find out if you are at risk for hepatitis B infection. Hepatitis C Blood testing is recommended for:  Everyone born from [birth year] through 1965.  Anyone with known risk factors for hepatitis C. Sexually transmitted infections (STIs)  You should be screened each year for STIs, including gonorrhea and chlamydia, if: ? You are sexually active and are younger than 41 years of age. ? You are older than 41 years of age and your health care provider tells you that you are at risk for this type of infection. ? Your sexual activity has changed since you were last screened, and you are at increased risk for chlamydia or gonorrhea. Ask your health care provider if you are at risk.  Ask your health care provider about whether you are at high risk for HIV. Your health care provider may recommend a prescription medicine to help prevent HIV infection. If you choose to take medicine to prevent HIV, you should first get tested for HIV. You should then be tested every 3 months for as long as you are taking the medicine. Follow these instructions at home: Lifestyle  Do not use any products that contain nicotine or tobacco, such as cigarettes, e-cigarettes, and chewing tobacco. If you need help quitting, ask your health care provider.  Do not use street drugs.  Do not share needles.  Ask your health care provider for help if you need support or  information about  quitting drugs. Alcohol use  Do not drink alcohol if your health care provider tells you not to drink.  If you drink alcohol: ? Limit how much you have to 0-2 drinks a day. ? Be aware of how much alcohol is in your drink. In the [place] one drink equals one 12 oz bottle of beer (355 mL), one 5 oz glass of wine (148 mL), or one 1 oz glass of hard liquor (44 mL). General instructions  Schedule regular health, dental, and eye exams.  Stay current with your vaccines.  Tell your health care provider if: ? You often feel depressed. ? You have ever been abused or do not feel safe at home. Summary  Adopting a healthy lifestyle and getting preventive care are important in promoting health and wellness.  Follow your health care provider's instructions about healthy diet, exercising, and getting tested or screened for diseases.  Follow your health care provider's instructions on monitoring your cholesterol and blood pressure. This information is not intended to replace advice given to you by your health care provider. Make sure you discuss any questions you have with your health care provider. Document Revised: 03/10/2018 Document Reviewed: 03/10/2018 Elsevier Patient Education  2020 Elsevier Inc.      Edwina Barth, MD [facility]

## 2020-03-14 ENCOUNTER — Telehealth: Payer: Self-pay | Admitting: Emergency Medicine

## 2020-03-14 LAB — COMPREHENSIVE METABOLIC PANEL
ALT: 26 IU/L (ref 0–44)
AST: 25 IU/L (ref 0–40)
Albumin/Globulin Ratio: 1.8 (ref 1.2–2.2)
Albumin: 4.9 g/dL (ref 4.0–5.0)
Alkaline Phosphatase: 63 IU/L (ref 44–121)
BUN/Creatinine Ratio: 11 (ref 9–20)
BUN: 9 mg/dL (ref 6–24)
Bilirubin Total: 0.4 mg/dL (ref 0.0–1.2)
CO2: 26 mmol/L (ref 20–29)
Calcium: 9.6 mg/dL (ref 8.7–10.2)
Chloride: 101 mmol/L (ref 96–106)
Creatinine, Ser: 0.84 mg/dL (ref 0.76–1.27)
GFR calc Af Amer: 126 mL/min/{1.73_m2} (ref >59)
GFR calc non Af Amer: 109 mL/min/{1.73_m2} (ref >59)
Globulin, Total: 2.8 g/dL (ref 1.5–4.5)
Glucose: 93 mg/dL (ref 65–99)
Potassium: 4.1 mmol/L (ref 3.5–5.2)
Sodium: 140 mmol/L (ref 134–144)
Total Protein: 7.7 g/dL (ref 6.0–8.5)

## 2020-03-14 LAB — CBC WITH DIFFERENTIAL/PLATELET
Basophils Absolute: 0 10*3/uL (ref 0.0–0.2)
Basos: 1 %
EOS (ABSOLUTE): 0 10*3/uL (ref 0.0–0.4)
Eos: 1 %
Hematocrit: 43.2 % (ref 37.5–51.0)
Hemoglobin: 14.5 g/dL (ref 13.0–17.7)
Immature Grans (Abs): 0 10*3/uL (ref 0.0–0.1)
Immature Granulocytes: 1 %
Lymphocytes Absolute: 1.3 10*3/uL (ref 0.7–3.1)
Lymphs: 29 %
MCH: 28.4 pg (ref 26.6–33.0)
MCHC: 33.6 g/dL (ref 31.5–35.7)
MCV: 85 fL (ref 79–97)
Monocytes Absolute: 0.4 10*3/uL (ref 0.1–0.9)
Monocytes: 9 %
Neutrophils Absolute: 2.7 10*3/uL (ref 1.4–7.0)
Neutrophils: 59 %
Platelets: 198 10*3/uL (ref 150–450)
RBC: 5.11 x10E6/uL (ref 4.14–5.80)
RDW: 12.4 % (ref 11.6–15.4)
WBC: 4.5 10*3/uL (ref 3.4–10.8)

## 2020-03-14 LAB — HEMOGLOBIN A1C
Est. average glucose Bld gHb Est-mCnc: 105 mg/dL
Hgb A1c MFr Bld: 5.3 % (ref 4.8–5.6)

## 2020-03-14 LAB — LIPID PANEL
Chol/HDL Ratio: 3.4 ratio (ref 0.0–5.0)
Cholesterol, Total: 174 mg/dL (ref 100–199)
HDL: 51 mg/dL (ref >39)
LDL Chol Calc (NIH): 102 mg/dL — ABNORMAL HIGH (ref 0–99)
Triglycerides: 120 mg/dL (ref 0–149)
VLDL Cholesterol Cal: 21 mg/dL (ref 5–40)

## 2020-03-14 LAB — TSH: TSH: 1.76 u[IU]/mL (ref 0.450–4.500)

## 2020-03-14 NOTE — Telephone Encounter (Signed)
Blood results discussed with patient. 

## 2020-10-06 ENCOUNTER — Ambulatory Visit (HOSPITAL_COMMUNITY)
Admission: EM | Admit: 2020-10-06 | Discharge: 2020-10-06 | Disposition: A | Discharge status: Home / Self Care | Payer: 59 | Attending: Urgent Care | Admitting: Urgent Care

## 2020-10-06 ENCOUNTER — Ambulatory Visit (INDEPENDENT_AMBULATORY_CARE_PROVIDER_SITE_OTHER): Payer: 59

## 2020-10-06 ENCOUNTER — Encounter (HOSPITAL_COMMUNITY): Payer: Self-pay

## 2020-10-06 DIAGNOSIS — M436 Torticollis: Secondary | ICD-10-CM | POA: Diagnosis not present

## 2020-10-06 DIAGNOSIS — M542 Cervicalgia: Secondary | ICD-10-CM

## 2020-10-06 MED ORDER — PREDNISONE 20 MG PO TABS: ORAL_TABLET | ORAL | 0 refills | Status: AC

## 2020-10-06 MED ORDER — TIZANIDINE HCL 4 MG PO TABS: 4.0000 mg | ORAL_TABLET | Freq: Three times a day (TID) | ORAL | 0 refills | Status: AC | PRN

## 2020-10-06 NOTE — ED Provider Notes (Signed)
[facility]   MRN: [id number] DOB: 1980-11-24  Subjective:   Jose Sparks is a 40 y.o. male presenting for 1 month history of persistent bilateral neck pain worse with movement/rotation to either side.  Wakes up with stiffness of his neck or if he lays down for a prolonged period of time also has stiffness.  Initially thought he slept wrong but has not improved and has progressed and worsened.  He has been using over-the-counter ibuprofen without resolution of his symptoms.  Denies fall, trauma, weakness, numbness or tingling, radicular symptoms, fever, headache, nausea or vomiting.  No history of back injuries, neck injuries, surgeries.  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  He is not currently taking any medications and has no known food or drug allergies.  Denies past medical and surgical history.  Family History  Problem Relation Age of Onset   Diabetes Mother     Social History   Tobacco Use   Smoking status: Never   Smokeless tobacco: Never  Vaping Use   Vaping Use: Never used  Substance Use Topics   Alcohol use: No    Alcohol/week: 0.0 standard drinks   Drug use: No    ROS   Objective:   Vitals: BP 128/84 (BP Location: Left Arm)   Pulse 60   Temp 99 F (37.2 C) (Oral)   Resp 18   SpO2 100%   Physical Exam Constitutional:      General: He is not in acute distress.    Appearance: Normal appearance. He is well-developed and normal weight. He is not ill-appearing, toxic-appearing or diaphoretic.  HENT:     Head: Normocephalic and atraumatic.     Right Ear: External ear normal.     Left Ear: External ear normal.     Nose: Nose normal.     Mouth/Throat:     Pharynx: Oropharynx is clear.  Eyes:     General: No scleral icterus.       Right eye: No discharge.        Left eye: No discharge.     Extraocular Movements: Extraocular movements intact.     Pupils:  Pupils are equal, round, and reactive to light.  Cardiovascular:     Rate and Rhythm: Normal rate.  Pulmonary:     Effort: Pulmonary effort is normal.  Musculoskeletal:     Cervical back: Normal range of motion. Spasms present. No swelling, edema, deformity, erythema, signs of trauma, lacerations, rigidity, torticollis, tenderness, bony tenderness or crepitus. Pain with movement present. Normal range of motion.     Comments: Strength 5/5 for upper and lower extremities.  Full range of motion throughout.  Ambulates without any assistance.  Negative Spurling maneuver and Lhermitte sign.  Neurological:     Mental Status: He is alert and oriented to person, place, and time.  Psychiatric:        Mood and Affect: Mood normal.        Behavior: Behavior normal.        Thought Content: Thought content normal.        Judgment: Judgment normal.    DG Cervical Spine Complete  Result Date: 10/06/2020 CLINICAL DATA:  Neck pain for 1 month.  No known injury EXAM: CERVICAL SPINE - COMPLETE 4+ VIEW COMPARISON:  None. FINDINGS: There is no evidence of cervical spine fracture or prevertebral soft tissue swelling. Alignment is normal. No other significant  bone abnormalities are identified. IMPRESSION: Negative cervical spine radiographs. Electronically Signed   By: Drusilla Kanner M.D.   On: 10/06/2020 13:47     Assessment and Plan :   PDMP not reviewed this encounter.  1. Neck pain   2. Neck stiffness     Given duration and severity of his pain at this point will use an oral prednisone course especially since he has failed over-the-counter NSAID treatment.  Use tizanidine as well.  Counseled on back care.  [facility] if his symptoms persist. Counseled patient on potential for adverse effects with medications prescribed/recommended today, ER and return-to-clinic precautions discussed, patient verbalized understanding.    Wallis Bamberg, [place] 10/06/20  1439

## 2020-10-06 NOTE — ED Triage Notes (Signed)
Pt presents with neck pain, non injury related X 1 month.

## 2022-07-15 ENCOUNTER — Other Ambulatory Visit (INDEPENDENT_AMBULATORY_CARE_PROVIDER_SITE_OTHER): Payer: 59

## 2022-07-15 ENCOUNTER — Ambulatory Visit: Payer: 59 | Admitting: Orthopaedic Surgery

## 2022-07-15 DIAGNOSIS — M79642 Pain in left hand: Secondary | ICD-10-CM

## 2022-07-15 DIAGNOSIS — M79641 Pain in right hand: Secondary | ICD-10-CM

## 2022-07-15 NOTE — Progress Notes (Signed)
Office Visit Note   Patient: Jose Sparks           Date of Birth: 1980/05/28           MRN: [id number] Visit Date: 07/15/2022              Requested by: Georgina Quint, MD [address] [place],  [place] [address] PCP: Georgina Quint, MD   Assessment & Plan: Visit Diagnoses:  1. 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  Disease process explained.  I recommended supplements as well as Voltaren gel to try.  Reassurance was provided that I do not see anything serious happening.  Follow-Up Instructions: No follow-ups on file.   Orders:  Orders Placed This Encounter  Procedures   XR Hand Complete Right   XR Hand Complete Left   No orders of the defined types were placed in this encounter.     Procedures: No procedures performed   Clinical Data: No additional findings.   Subjective: Chief Complaint  Patient presents with   Right Hand - Pain   Left Hand - Pain    HPI  Jose Sparks is a 42 year old gentleman who comes in for bilateral hand and finger pain minus the thumbs.  States that he has about 20 to 30 minutes of stiffness in the morning during which she has trouble making a fist but then it gets better.  Has pain in the joints throughout the day at times.  Cold weather makes it worse.  Denies any locking.  Endorses some numbness at times.  Review of Systems  Constitutional: Negative.   HENT: Negative.    Eyes: Negative.   Respiratory: Negative.    Cardiovascular: Negative.   Gastrointestinal: Negative.   Endocrine: Negative.   Genitourinary: Negative.   Skin: Negative.   Allergic/Immunologic: Negative.   Neurological: Negative.   Hematological: Negative.   Psychiatric/Behavioral: Negative.    All other systems reviewed and are negative.    Objective: Vital Signs: There were no vitals taken for this visit.  Physical Exam Vitals and  nursing note reviewed.  Constitutional:      Appearance: He is well-developed.  HENT:     Head: Normocephalic and atraumatic.  Eyes:     Pupils: Pupils are equal, round, and reactive to light.  Pulmonary:     Effort: Pulmonary effort is normal.  Abdominal:     Palpations: Abdomen is soft.  Musculoskeletal:        General: Normal range of motion.     Cervical back: Neck supple.  Skin:    General: Skin is warm.  Neurological:     Mental Status: He is alert and oriented to person, place, and time.  Psychiatric:        Behavior: Behavior normal.        Thought Content: Thought content normal.        Judgment: Judgment normal.     Ortho Exam  Examination bilateral hands show negative carpal tunnel compressive signs.  Normal appearance of the hands and the fingers.  No neurovascular compromise.  Endorses pain in the DIP and PIP joints.  No palpable abnormalities.  Specialty Comments:  No specialty comments available.  Imaging: XR Hand Complete Left  Result Date: 07/15/2022 No acute or structural abnormalities  XR Hand Complete Right  Result Date: 07/15/2022 No acute or structural abnormalities.    PMFS History:  There are no problems to display for this patient.  No past medical history on file.  Family History  Problem Relation Age of Onset   Diabetes Mother     No past surgical history on file. Social History   Occupational History   Not on file  Tobacco Use   Smoking status: Never   Smokeless tobacco: Never  Vaping Use   Vaping Use: Never used  Substance and Sexual Activity   Alcohol use: No    Alcohol/week: 0.0 standard drinks of alcohol   Drug use: No   Sexual activity: Not on file
